# Patient Record
Sex: Male | Born: 1985 | Race: White | Hispanic: No | Marital: Single | Smoking: Never smoker
Health system: Southern US, Community
[De-identification: ages and names within clinical notes are randomized; demographics above are authoritative.]

## PROBLEM LIST (undated history)

## (undated) DIAGNOSIS — H9190 Unspecified hearing loss, unspecified ear: Secondary | ICD-10-CM

## (undated) DIAGNOSIS — E119 Type 2 diabetes mellitus without complications: Secondary | ICD-10-CM

---
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
## 2008-05-10 ENCOUNTER — Emergency Department (HOSPITAL_COMMUNITY): Admission: EM | Admit: 2008-05-10 | Discharge: 2008-05-10 | Payer: Self-pay | Admitting: Emergency Medicine

## 2010-09-20 ENCOUNTER — Emergency Department (HOSPITAL_COMMUNITY): Admission: EM | Admit: 2010-09-20 | Discharge: 2010-09-20 | Payer: Self-pay | Admitting: Emergency Medicine

## 2011-02-11 LAB — CBC
HCT: 46.5 % (ref 39.0–52.0)
Hemoglobin: 16.2 g/dL (ref 13.0–17.0)
MCH: 28 pg (ref 26.0–34.0)
MCHC: 34.9 g/dL (ref 30.0–36.0)
MCV: 80.1 fL (ref 78.0–100.0)
Platelets: 190 10*3/uL (ref 150–400)

## 2011-02-11 LAB — BASIC METABOLIC PANEL
GFR calc Af Amer: >60 mL/min (ref >60)
Glucose, Bld: 126 mg/dL — ABNORMAL HIGH (ref 70–99)
Potassium: 3.9 mEq/L (ref 3.5–5.1)

## 2011-02-11 LAB — DIFFERENTIAL
Basophils Absolute: 0 10*3/uL (ref 0.0–0.1)
Monocytes Relative: 7 % (ref 3–12)
Neutrophils Relative %: 49 % (ref 43–77)

## 2018-03-16 ENCOUNTER — Emergency Department (HOSPITAL_BASED_OUTPATIENT_CLINIC_OR_DEPARTMENT_OTHER)
Admission: EM | Admit: 2018-03-16 | Discharge: 2018-03-16 | Disposition: A | Discharge status: Home / Self Care | Payer: BLUE CROSS/BLUE SHIELD | Attending: Emergency Medicine | Admitting: Emergency Medicine

## 2018-03-16 ENCOUNTER — Encounter (HOSPITAL_BASED_OUTPATIENT_CLINIC_OR_DEPARTMENT_OTHER): Payer: Self-pay | Admitting: *Deleted

## 2018-03-16 ENCOUNTER — Other Ambulatory Visit: Payer: Self-pay

## 2018-03-16 DIAGNOSIS — K029 Dental caries, unspecified: Secondary | ICD-10-CM | POA: Insufficient documentation

## 2018-03-16 DIAGNOSIS — K0889 Other specified disorders of teeth and supporting structures: Secondary | ICD-10-CM | POA: Diagnosis present

## 2018-03-16 HISTORY — DX: Unspecified hearing loss, unspecified ear: H91.90

## 2018-03-16 MED ORDER — IBUPROFEN 100 MG/5ML PO SUSP: 800.0000 mg | ORAL | 0 refills | Status: AC | PRN

## 2018-03-16 MED ORDER — AMOXICILLIN 400 MG/5ML PO SUSR: 1000.0000 mg | Freq: Three times a day (TID) | ORAL | 0 refills | Status: AC

## 2018-03-16 NOTE — ED Notes (Signed)
Pt is HOH with personal amplifier and bilateral hearing aides.

## 2018-03-16 NOTE — ED Triage Notes (Signed)
Pt c/o right dental pain x 1 month

## 2018-03-16 NOTE — ED Notes (Signed)
Pt asked how much longer will be here in the ED, because he hasn't eaten and he is hungry. Greta DoomBowie, PA made aware. Pt given crackers, peanut butter, and updated on plan of care.

## 2018-03-16 NOTE — ED Provider Notes (Addendum)
[facility] EMERGENCY DEPARTMENT Provider Note   CSN: [id number] Arrival date & time: 03/16/18  2008     History   Chief Complaint Chief Complaint  Patient presents with  . Dental Pain    HPI Eric Hines is a 32 y.o. male.  HPI   32 year old male presenting for evaluation of dental pain.  Patient report for approximately 1 month and a half he has had recurrent worsening dental pain.  Pain is involving his right upper and lower molar.  He is aware that there is decay in the molar and needs to have it extracted.  He was initially seen by a dentist for this complaint and was referred to an oral surgeon but unable to get care because he does not have insurance.  He did receive amoxicillin antibiotic over a month ago and it did help.  He is currently taking liquid Motrin with some relief.  Increasing pain with chewing and sometimes with temperature changes.  Pain is moderate in severity.  There is no associated fever, hearing changes, neck pain, or trouble swallowing.  Denies any recent injury.  Past Medical History:  Diagnosis Date  . Deaf     There are no active problems to display for this patient.   History reviewed. No pertinent surgical history.      Home Medications    Prior to Admission medications   Medication Sig Start Date End Date Taking? Authorizing Provider  ibuprofen (ADVIL,MOTRIN) 100 MG/5ML suspension Take 800 mg by mouth every 4 (four) hours as needed.   Yes [provider]    Family History History reviewed. No pertinent family history.  Social History Social History   Tobacco Use  . Smoking status: Never Smoker  . Smokeless tobacco: Never Used  Substance Use Topics  . Alcohol use: Never    Frequency: Never  . Drug use: Never     Allergies   Latex   Review of Systems Review of Systems  Constitutional: Negative for fever.  HENT: Positive for dental problem. Negative for ear pain.   Respiratory: Negative for  shortness of breath.   Cardiovascular: Negative for chest pain.     Physical Exam Updated Vital Signs BP (!) 171/92   Pulse 75   Temp 98.8 F (37.1 C) (Oral)   Resp 16   Ht 5\' 6"  (1.676 m)   Wt (!) 140.6 kg (310 lb)   SpO2 98%   BMI 50.04 kg/m   Physical Exam  Constitutional: He appears well-developed and well-nourished. No distress.  HENT:  Head: Atraumatic.  Right Ear: External ear normal.  Left Ear: External ear normal.  Mouth: Moderate dental decay noted to tooth #1 and tooth #32 with tenderness to palpation but no obvious abscess or gingival erythema noted.  No trismus.  Eyes: Conjunctivae are normal.  Neck: Neck supple.  Neurological: He is alert.  Skin: No rash noted.  Psychiatric: He has a normal mood and affect.  Nursing note and vitals reviewed.    ED Treatments / Results  Labs (all labs ordered are listed, but only abnormal results are displayed) Labs Reviewed - No data to display  EKG None  Radiology No results found.  Procedures Procedures (including critical care time)  Medications Ordered in ED Medications - No data to display   Initial Impression / Assessment and Plan / ED Course  I have reviewed the triage vital signs and the nursing notes.  Pertinent labs & imaging results that were available during my  care of the patient were reviewed by me and considered in my medical decision making (see chart for details).     BP (!) 171/92   Pulse 75   Temp 98.8 F (37.1 C) (Oral)   Resp 16   Ht 5\' 6"  (1.676 m)   Wt (!) 140.6 kg (310 lb)   SpO2 98%   BMI 50.04 kg/m  The patient was noted to be hypertensive today in the emergency department. I have spoken with the patient regarding hypertension and the need for improved management. I instructed the patient to followup with the Primary care doctor within 4 days to improve the management of the patient's hypertension. I also counseled the patient regarding the signs and symptoms which would  require an emergent visit to an emergency department for hypertensive urgency and/or hypertensive emergency. The patient understood the need for improved hypertensive management.   Final Clinical Impressions(s) / ED Diagnoses   Final diagnoses:  Pain due to dental caries    ED Discharge Orders        Ordered    ibuprofen (ADVIL,MOTRIN) 100 MG/5ML suspension  Every 4 hours PRN     03/16/18 2300    amoxicillin (AMOXIL) 400 MG/5ML suspension  3 times daily     03/16/18 2300     10:15 PM Patient here with recurrent dental pain involving his right upper and lower molar.  There is evidence of decay.  No obvious abscess appreciated.  No facial involvement.  Patient discharged home with amoxicillin.  Dental referral given as needed.  Return precautions discussed. Pt can only tolerates liquid medications.    Fayrene Helperran, Purl Claytor, PA-C 03/16/18 2300    Fayrene Helperran, Gray Maugeri, PA-C 03/16/18 [id number]    Benjiman CorePickering, Nathan, MD 03/17/18 [phone]

## 2022-02-04 ENCOUNTER — Other Ambulatory Visit: Payer: Self-pay

## 2022-02-04 ENCOUNTER — Emergency Department (INDEPENDENT_AMBULATORY_CARE_PROVIDER_SITE_OTHER): Payer: BC Managed Care – PPO

## 2022-02-04 ENCOUNTER — Emergency Department
Admission: EM | Admit: 2022-02-04 | Discharge: 2022-02-04 | Disposition: A | Discharge status: Home / Self Care | Payer: BC Managed Care – PPO | Source: Home / Self Care | Attending: Family Medicine | Admitting: Family Medicine

## 2022-02-04 ENCOUNTER — Encounter: Payer: Self-pay | Admitting: Emergency Medicine

## 2022-02-04 DIAGNOSIS — R109 Unspecified abdominal pain: Secondary | ICD-10-CM

## 2022-02-04 DIAGNOSIS — Z91199 Patient's noncompliance with other medical treatment and regimen due to unspecified reason: Secondary | ICD-10-CM

## 2022-02-04 DIAGNOSIS — R030 Elevated blood-pressure reading, without diagnosis of hypertension: Secondary | ICD-10-CM

## 2022-02-04 DIAGNOSIS — R739 Hyperglycemia, unspecified: Secondary | ICD-10-CM

## 2022-02-04 HISTORY — DX: Morbid (severe) obesity due to excess calories: E66.01

## 2022-02-04 HISTORY — DX: Type 2 diabetes mellitus without complications: E11.9

## 2022-02-04 LAB — POCT URINALYSIS DIP (MANUAL ENTRY)
Bilirubin, UA: NEGATIVE
Blood, UA: NEGATIVE
Glucose, UA: 500 mg/dL — AB
Ketones, POC UA: NEGATIVE mg/dL
Leukocytes, UA: NEGATIVE
Nitrite, UA: NEGATIVE
Protein Ur, POC: NEGATIVE mg/dL
Spec Grav, UA: 1.015 (ref 1.010–1.025)
Urobilinogen, UA: 0.2 E.U./dL
pH, UA: 6 (ref 5.0–8.0)

## 2022-02-04 LAB — POCT FASTING CBG KUC MANUAL ENTRY: POCT Glucose (KUC): 290 mg/dL — AB (ref 70–99)

## 2022-02-04 MED ORDER — ORA-SWEET PO SYRP: 20.0000 mg | Freq: Two times a day (BID) | ORAL | 0 refills | Status: AC

## 2022-02-04 NOTE — ED Notes (Signed)
Critical lab result on U/A - glucose 500mg /dl - results to Dr verbally - results verified verbally by Dr Delton See ?

## 2022-02-04 NOTE — Discharge Instructions (Addendum)
I believe that your right flank pain is muscular.  No evidence of kidney stone or kidney disease ?Take ibuprofen suspension 600 mg to 800 mg 3 times a day.  Take with food ?I have prescribed Lioresal suspension.  This is a mild muscle relaxer.  It will help at bedtime.  Use with caution when you are on your way to work and with driving ?Use ice or heat to painful back area ? ?It is important that you see your primary care doctor at least twice a year for diabetes and blood pressure.  You need to be back on diabetes medications to prevent complications ? ?

## 2022-02-04 NOTE — ED Provider Notes (Signed)
?[facility] ? ? ? ?CSN: [id number] ?Arrival date & time: 02/04/22  1149 ? ? ?  ? ?History   ?Chief Complaint ?Chief Complaint  ?Patient presents with  ? Flank Pain  ?  right  ? ? ?HPI ?Eric Hines is a 36 y.o. male.  ? ?HPI ?Patient seen for flank pain ?He prefers lipreading as a form of communication however this is difficult with the current mask recommendations during COVID 19 pandemic ?American sign language interpreter used for portion of evaluation ?Patient is a diabetic has not been under medical care taking any medication over the last couple of years ?He says he has had flank pain Janey Greaser on for 3 weeks, worse today.  He has not noticed dysuria, frequency, hematuria, nausea or vomiting, fever or chills. ? ?Past Medical History:  ?Diagnosis Date  ? Deaf   ? Diabetes mellitus without complication (HCC)   ? Morbid obesity due to excess calories (HCC)   ? ? ?Patient Active Problem List  ? Diagnosis Date Noted  ? Morbid obesity due to excess calories (HCC) 02/04/2022  ? ? ?History reviewed. No pertinent surgical history. ? ? ? ? ?Home Medications   ? ?Prior to Admission medications   ?Medication Sig Start Date End Date Taking? Authorizing Provider  ?baclofen (LIORESAL) oral suspension 10 mg/mL mixture Take 2 mLs (20 mg total) by mouth 2 (two) times daily. May cause drowsiness.  This is useful at bedtime. 02/04/22  Yes Eustace Moore, MD  ?ibuprofen (ADVIL,MOTRIN) 100 MG/5ML suspension Take 40 mLs (800 mg total) by mouth every 4 (four) hours as needed. 03/16/18   Fayrene Helper, PA-C  ? ? ?Family History ?Family History  ?Problem Relation Age of Onset  ? Healthy Mother   ? Cancer Father   ? ? ?Social History ?Social History  ? ?Tobacco Use  ? Smoking status: Never  ? Smokeless tobacco: Never  ?Vaping Use  ? Vaping Use: Never used  ?Substance Use Topics  ? Alcohol use: Never  ? Drug use: Never  ? ? ? ?Allergies   ?Latex ? ? ?Review of Systems ?Review of Systems ?See HPI ? ?Physical Exam ?Triage Vital  Signs ?ED Triage Vitals  ?Enc Vitals Group  ?   BP 02/04/22 1221 (!) 174/80  ?   Pulse Rate 02/04/22 1221 79  ?   Resp 02/04/22 1221 18  ?   Temp 02/04/22 1221 98.6 ?F (37 ?C)  ?   Temp Source 02/04/22 1221 Oral  ?   SpO2 02/04/22 1221 98 %  ?   Weight --   ?   Height 02/04/22 1227 5\' 6"  (1.676 m)  ?   Head Circumference --   ?   Peak Flow --   ?   Pain Score --   ?   Pain Loc --   ?   Pain Edu? --   ?   Excl. in GC? --   ? ?No data found. ? ?Updated Vital Signs ?BP (!) 174/80 (BP Location: Left Arm)   Pulse 79   Temp 98.6 ?F (37 ?C) (Oral)   Resp 18   Ht 5\' 6"  (1.676 m)   Wt 134.3 kg   SpO2 98%   BMI 47.78 kg/m?  ?:    ? ?Physical Exam ?Constitutional:   ?   General: He is not in acute distress. ?   Appearance: He is well-developed. He is obese. He is not ill-appearing.  ?HENT:  ?   Head: Normocephalic and  atraumatic.  ?   Nose:  ?   Comments: Mask is in place ?Eyes:  ?   Conjunctiva/sclera: Conjunctivae normal.  ?   Pupils: Pupils are equal, round, and reactive to light.  ?Cardiovascular:  ?   Rate and Rhythm: Normal rate and regular rhythm.  ?   Heart sounds: Normal heart sounds.  ?Pulmonary:  ?   Effort: Pulmonary effort is normal. No respiratory distress.  ?   Breath sounds: Normal breath sounds.  ?Chest:  ?   Chest wall: No tenderness.  ?Abdominal:  ?   General: Abdomen is flat. There is no distension.  ?   Palpations: Abdomen is soft.  ?   Tenderness: There is no abdominal tenderness. There is no right CVA tenderness or left CVA tenderness.  ?Musculoskeletal:     ?   General: Normal range of motion.  ?   Cervical back: Normal range of motion.  ?   Comments: Patient has pain with movement.  Grimaces as he gets on and off the table.  Tenderness in the right paraspinous lumbar muscles.  No CVA tenderness to percussion.  Abdominal exam with minor tenderness in the right middle abdomen.  ?Skin: ?   General: Skin is warm and dry.  ?Neurological:  ?   General: No focal deficit present.  ?   Mental Status: He  is alert.  ?Psychiatric:     ?   Mood and Affect: Mood normal.     ?   Behavior: Behavior normal.  ? ? ? ?UC Treatments / Results  ?Labs ?(all labs ordered are listed, but only abnormal results are displayed) ?Labs Reviewed  ?POCT URINALYSIS DIP (MANUAL ENTRY) - Abnormal; Notable for the following components:  ?    Result Value  ? Glucose, UA =500 (*)   ? All other components within normal limits  ?POCT FASTING CBG [facility] MANUAL ENTRY - Abnormal; Notable for the following components:  ? POCT Glucose ([facility]) 290 (*)   ? All other components within normal limits  ? ? ?EKG ? ? ?Radiology ?DG Chest 2 View ? ?Result Date: 02/04/2022 ?CLINICAL DATA:  Right flank pain EXAM: CHEST - 2 VIEW COMPARISON:  02/17/2017 FINDINGS: The cardiac silhouette, mediastinal and hilar contours are within normal limits. There is perihilar peribronchial thickening and slight increased interstitial markings suggesting bronchitis or interstitial pneumonitis. No focal airspace consolidation to suggest typical pneumonia. No pleural effusions or pulmonary lesions. The bony thorax is intact. IMPRESSION: Findings suggest bronchitis or interstitial pneumonitis. No infiltrates or effusions. Electronically Signed   By: Rudie MeyerP.  Gallerani M.D.   On: 02/04/2022 13:38  ? ?DG Abdomen 1 View ? ?Result Date: 02/04/2022 ?CLINICAL DATA:  Right flank pain for 3 weeks. EXAM: ABDOMEN - 1 VIEW COMPARISON:  None. FINDINGS: The bowel gas pattern is unremarkable. No findings for obstruction or perforation. The soft tissue shadows are maintained. No worrisome calcifications. The bony structures are. IMPRESSION: Unremarkable abdominal radiograph. Electronically Signed   By: Rudie MeyerP.  Gallerani M.D.   On: 02/04/2022 13:31   ? ?Procedures ?Procedures (including critical care time) ? ?Medications Ordered in UC ?Medications - No data to display ? ?Initial Impression / Assessment and Plan / UC Course  ?I have reviewed the triage vital signs and the nursing notes. ? ?Pertinent labs & imaging  results that were available during my care of the patient were reviewed by me and considered in my medical decision making (see chart for details). ? ?  ? ?Patient has flank pain.  I think it is musculoskeletal.  We will treat accordingly ?Patient has untreated diabetes, noncompliance, and hyperglycemia.  PCP follow-up is recommended ?Patient also has elevated blood pressure reading.  Needs follow-up ? ?Final Clinical Impressions(s) / UC Diagnoses  ? ?Final diagnoses:  ?Flank pain  ?Noncompliance with diabetes treatment  ?Hyperglycemia  ?Elevated blood pressure reading  ? ? ? ?Discharge Instructions   ? ?  ?I believe that your right flank pain is muscular.  No evidence of kidney stone or kidney disease ?Take ibuprofen suspension 600 mg to 800 mg 3 times a day.  Take with food ?I have prescribed Lioresal suspension.  This is a mild muscle relaxer.  It will help at bedtime.  Use with caution when you are on your way to work and with driving ?Use ice or heat to painful back area ? ?It is important that you see your primary care doctor at least twice a year for diabetes and blood pressure.  You need to be back on diabetes medications to prevent complications ? ? ? ? ? ?ED Prescriptions   ? ? Medication Sig Dispense Auth. Provider  ? baclofen (LIORESAL) oral suspension 10 mg/mL mixture Take 2 mLs (20 mg total) by mouth 2 (two) times daily. May cause drowsiness.  This is useful at bedtime. 120 mL Eustace Moore, MD  ? ?  ? ?PDMP not reviewed this encounter. ?  ?Eustace Moore, MD ?02/04/22 1359 ? ?

## 2022-02-04 NOTE — ED Triage Notes (Addendum)
Right flank pain per pt  ?Intermittent pain x 3 weeks  ?ASL interpretor used - pt prefers reading lips ?Pt drives a truck  ?Pt drinks 80-90 oz  H2O daily ?Denies fevers ?No OTC pain meds - liquid meds only ?

## 2022-02-06 ENCOUNTER — Telehealth: Payer: Self-pay

## 2022-02-06 MED ORDER — BACLOFEN 10 MG PO TABS: 10.0000 mg | ORAL_TABLET | Freq: Three times a day (TID) | ORAL | 0 refills | Status: AC

## 2022-02-06 NOTE — Telephone Encounter (Signed)
Patient requested pill form of Baclofen as he is unable to find liquid Baclofen.  Patient advised that Baclofen has been prescribed to his pharmacy per his request. ?

## 2023-07-18 IMAGING — DX DG CHEST 2V
2 series · 2 of 2 positions shown · non-contrast
Comparison: 02/17/2017

CLINICAL DATA: Right flank pain

EXAM:
CHEST - 2 VIEW

[chest pa]
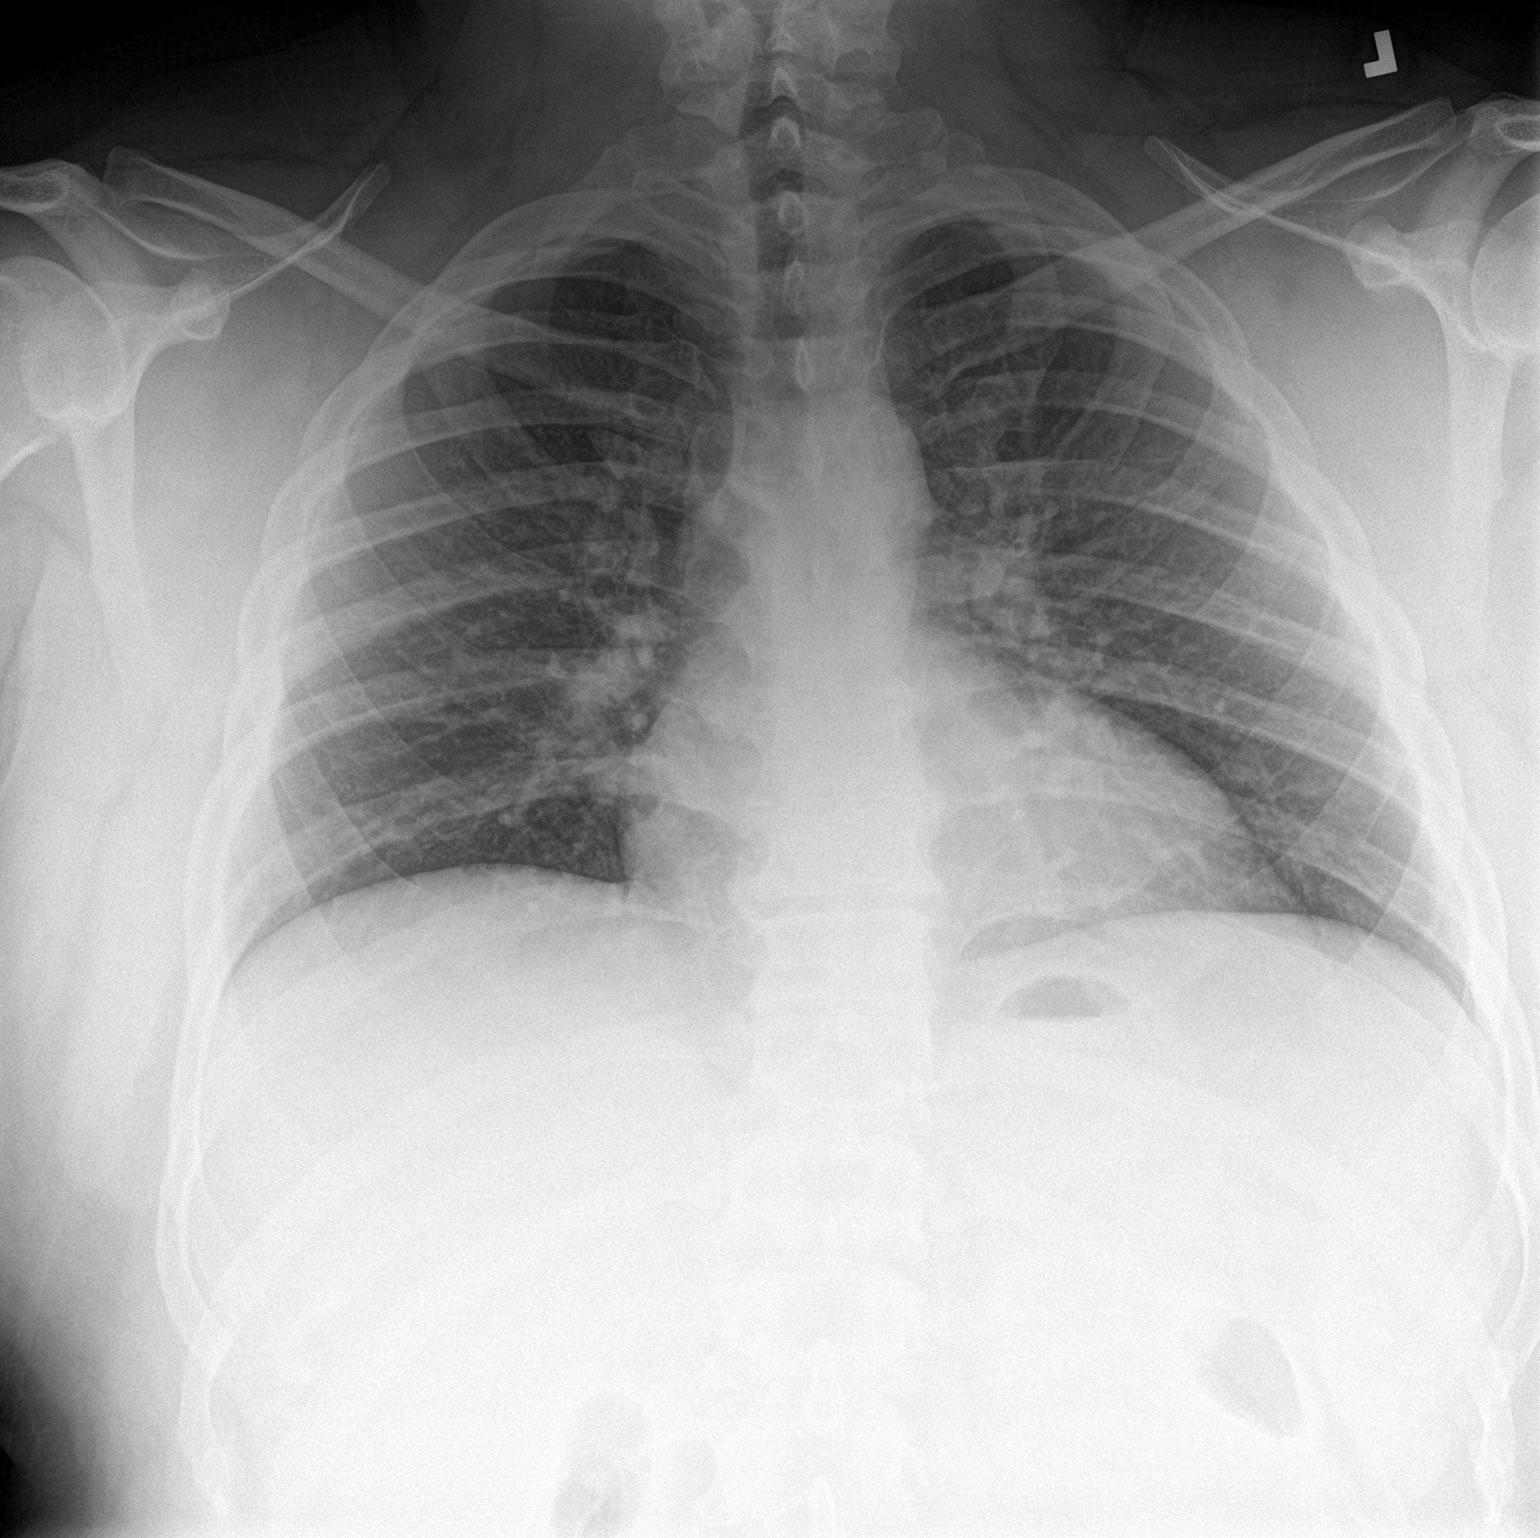

[chest lat]
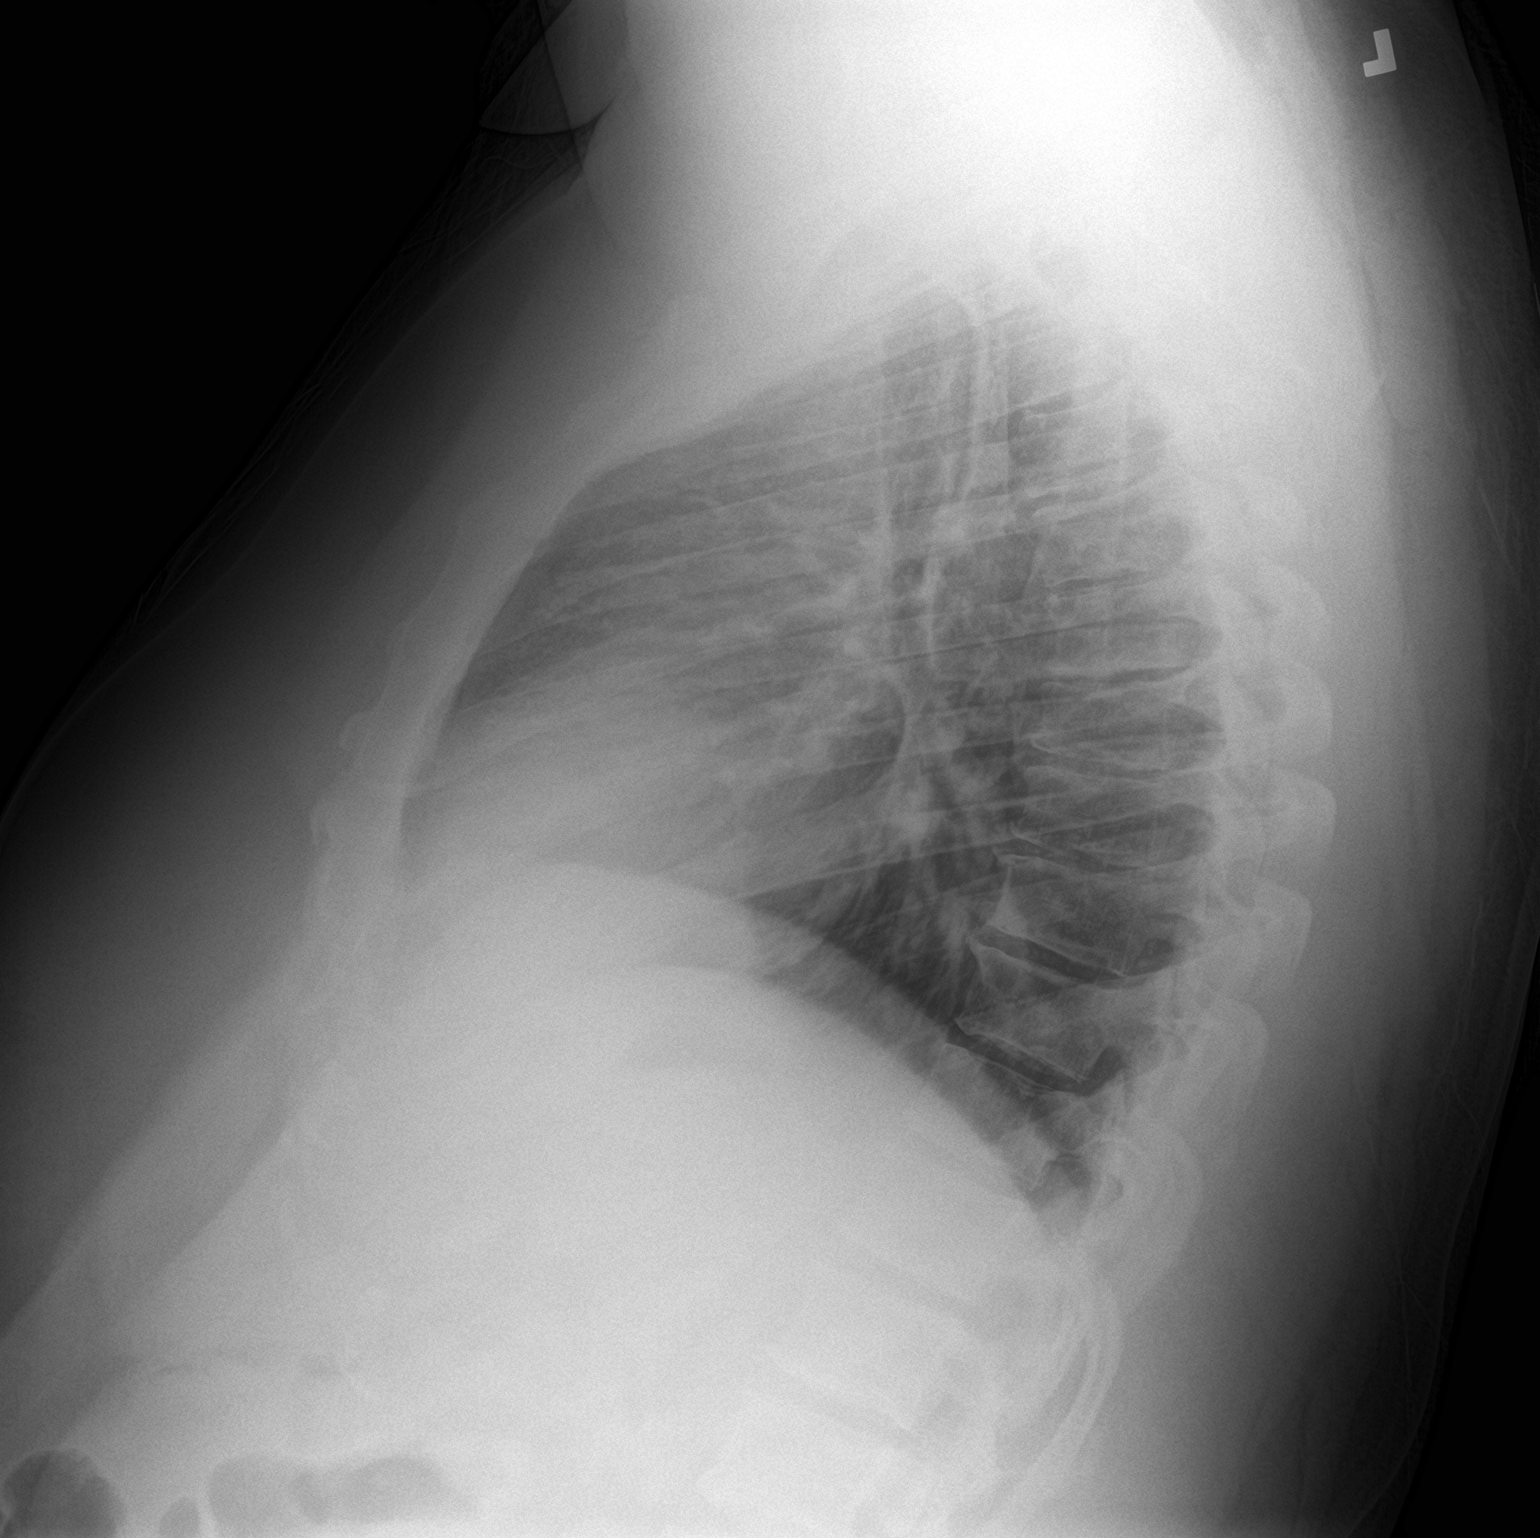

[2 of 2 positions shown; findings below may reference images not displayed]

FINDINGS: The cardiac silhouette, mediastinal and hilar contours are within
normal limits. There is perihilar peribronchial thickening and
slight increased interstitial markings suggesting bronchitis or
interstitial pneumonitis. No focal airspace consolidation to suggest
typical pneumonia. No pleural effusions or pulmonary lesions. The
bony thorax is intact.
IMPRESSION: Findings suggest bronchitis or interstitial pneumonitis. No
infiltrates or effusions.

## 2023-07-18 IMAGING — DX DG ABDOMEN 1V
4 series · 4 of 4 positions shown · non-contrast
Comparison: None.

CLINICAL DATA: Right flank pain for 3 weeks.

EXAM:
ABDOMEN - 1 VIEW

[abdomen kub (1 of 4)]
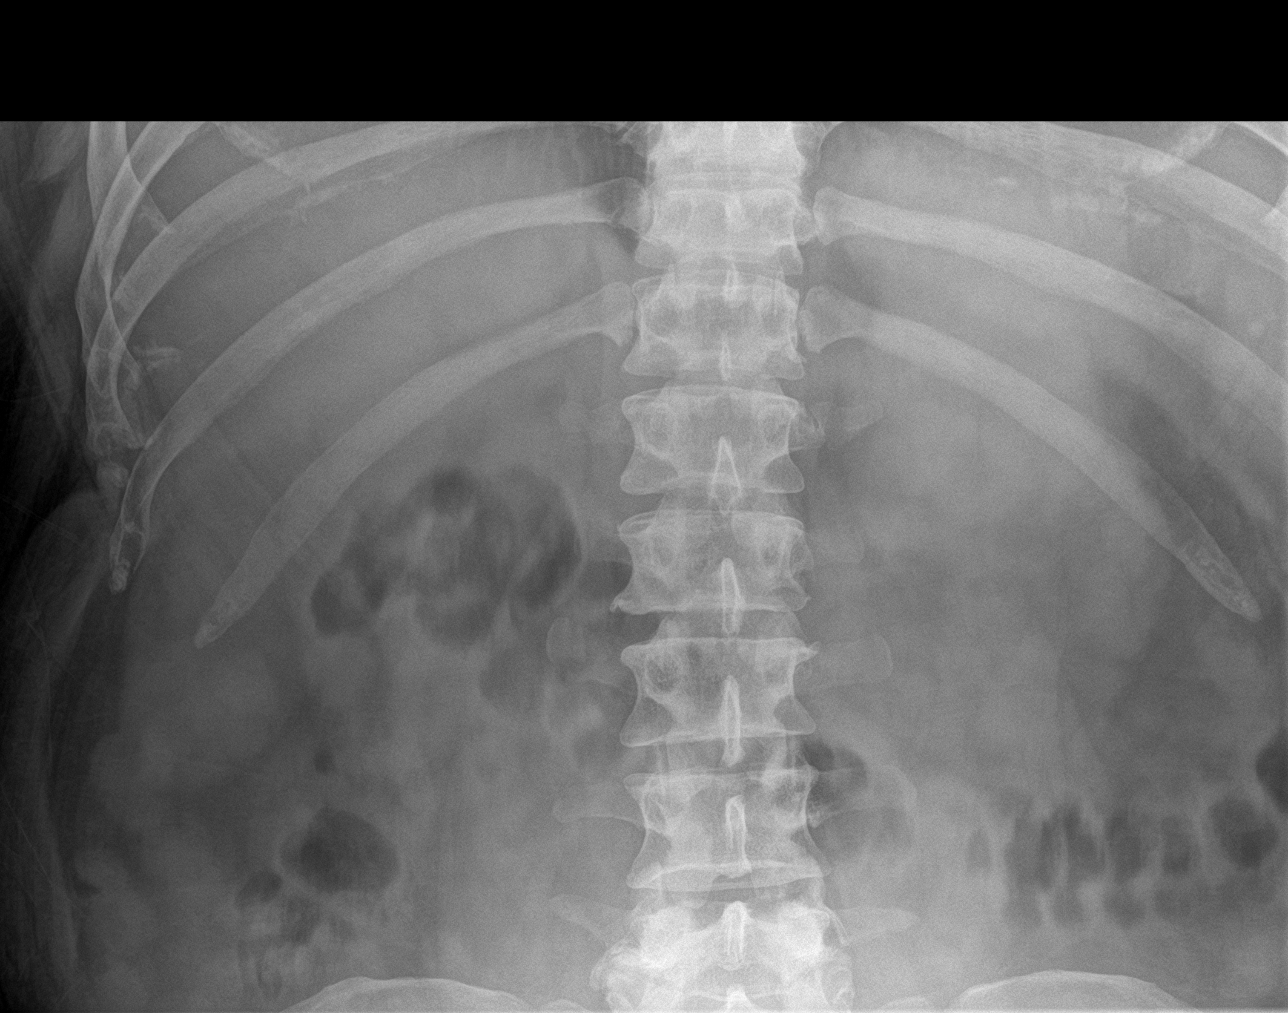

[abdomen kub (2 of 4)]
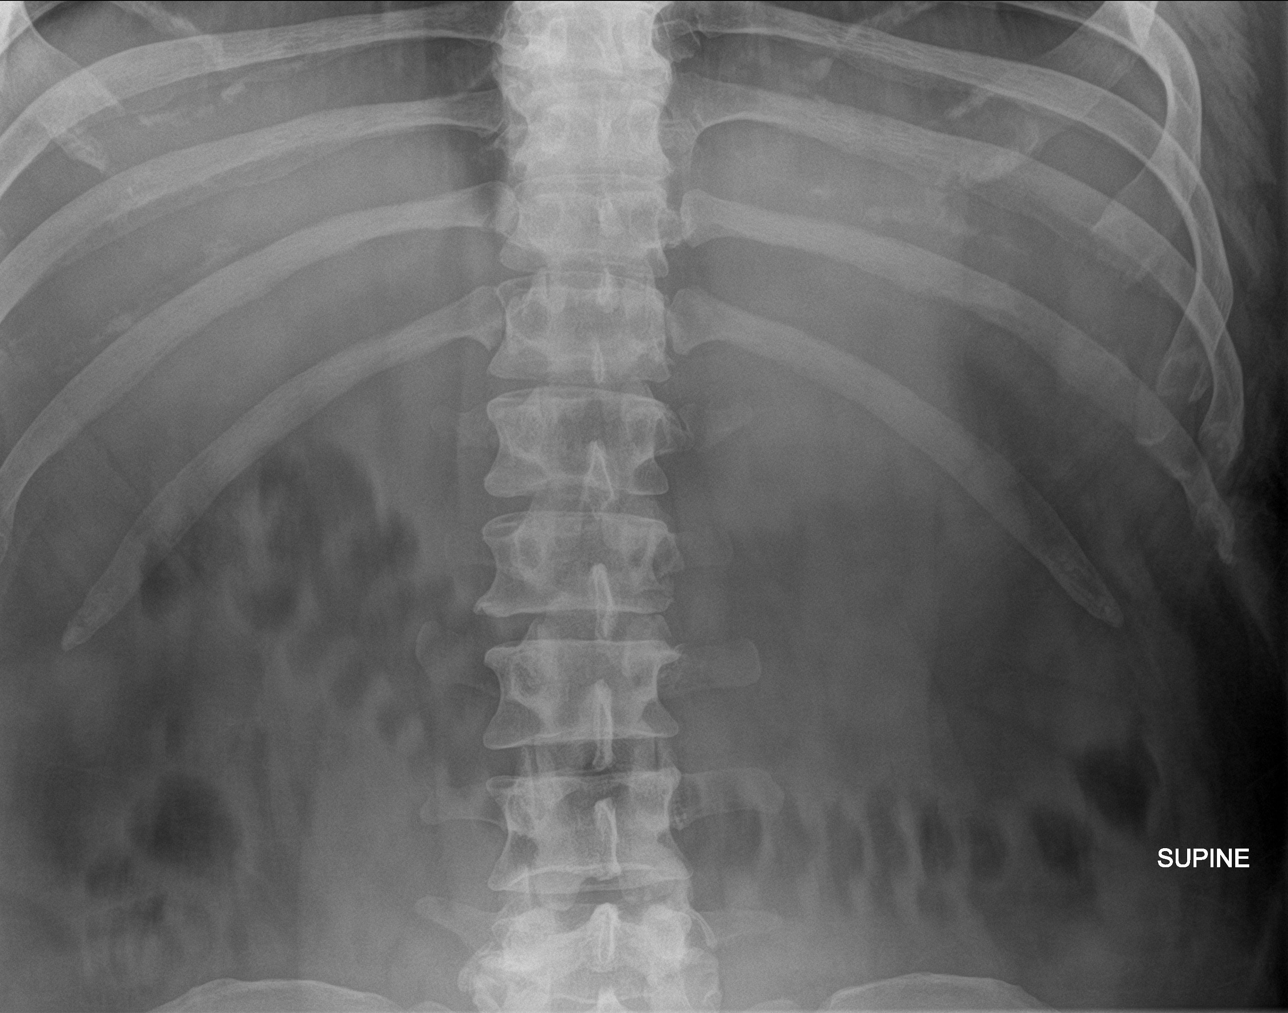

[abdomen kub (3 of 4)]
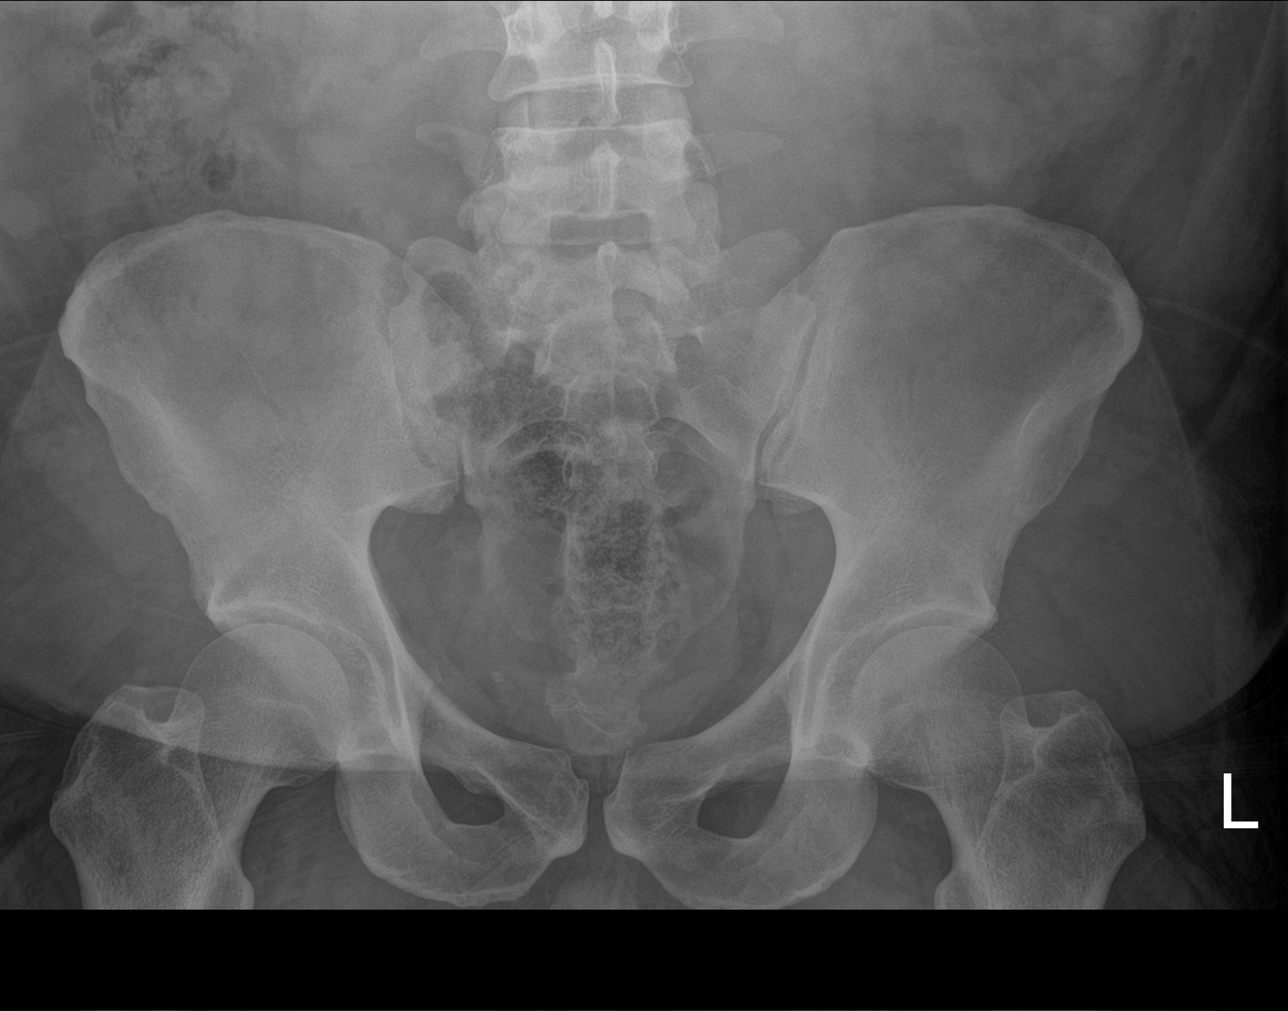

[abdomen kub (4 of 4)]
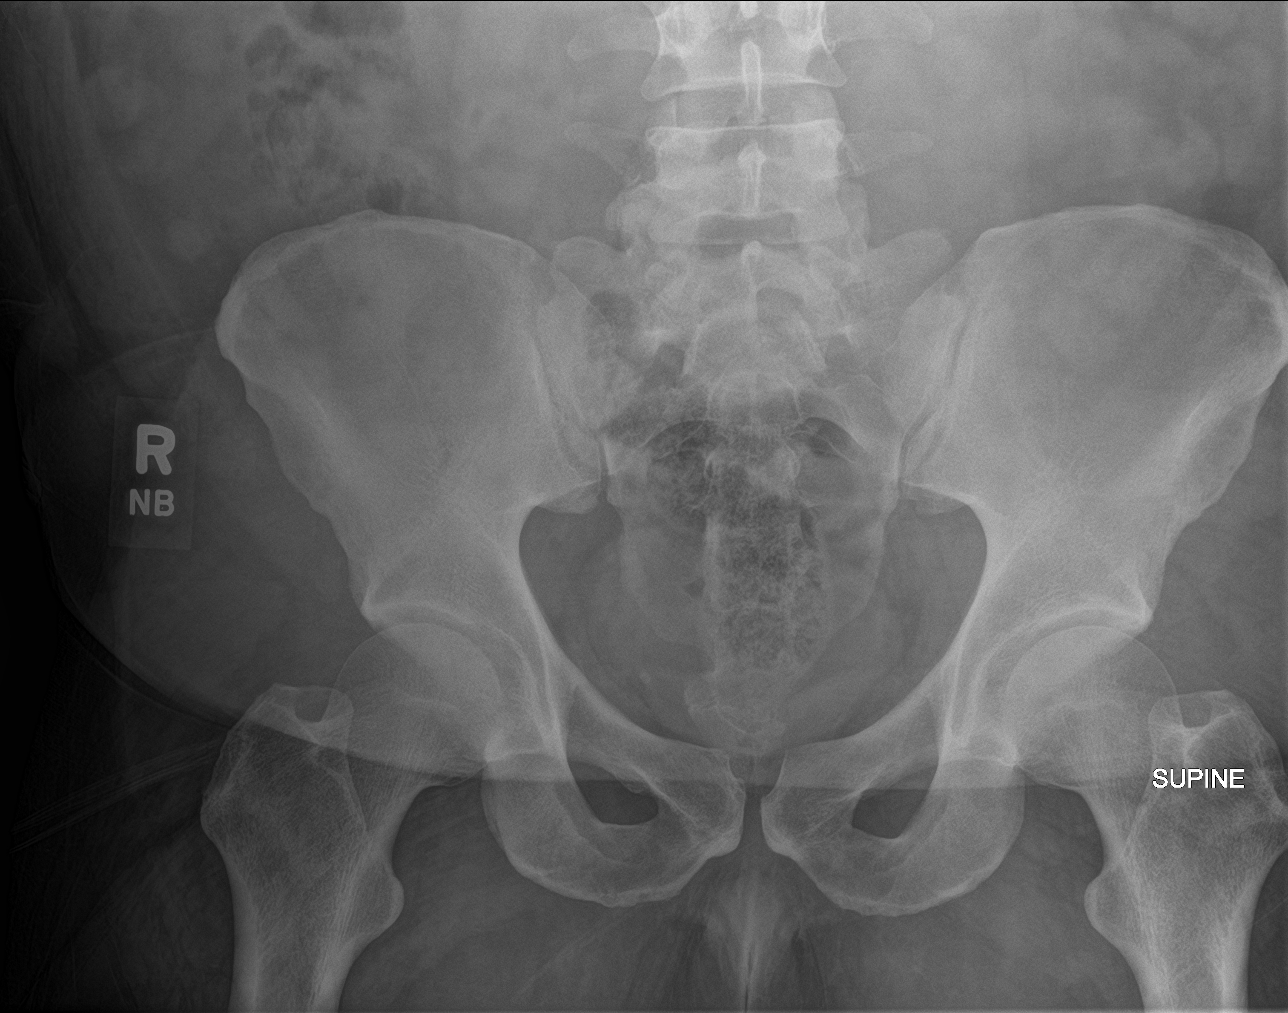

[4 of 4 positions shown; findings below may reference images not displayed]

FINDINGS: The bowel gas pattern is unremarkable. No findings for obstruction
or perforation. The soft tissue shadows are maintained. No worrisome
calcifications. The bony structures are.
IMPRESSION: Unremarkable abdominal radiograph.

## 2024-12-18 ENCOUNTER — Ambulatory Visit: Payer: Self-pay

## 2024-12-18 NOTE — Telephone Encounter (Signed)
 Reason for Disposition  [1] MODERATE pain (e.g., interferes with normal activities) AND [2] pain comes and goes (cramps) AND [3] present > 24 hours  (Exception: Pain with Vomiting or Diarrhea - see that Guideline.)  Answer Assessment - Initial Assessment Questions 1. LOCATION: Where does it hurt?      Right side below ribs 2. RADIATION: Does the pain shoot anywhere else? (e.g., chest, back)     no 3. ONSET: When did the pain begin? (Minutes, hours or days ago)      2 days 4. SUDDEN: Gradual or sudden onset?     sudden 5. PATTERN Does the pain come and go, or is it constant?     Constant - worse when he coughs for farts 6. SEVERITY: How bad is the pain?  (e.g., Scale 1-10; mild, moderate, or severe)     6-7/10 7. RECURRENT SYMPTOM: Have you ever had this type of stomach pain before? If Yes, ask: When was the last time? and What happened that time?      no 8. CAUSE: What do you think is causing the stomach pain? (e.g., gallstones, recent abdominal surgery)     Pulled muscle - maybe 9. RELIEVING/AGGRAVATING FACTORS: What makes it better or worse? (e.g., antacids, bending or twisting motion, bowel movement)     Not that he knows of 10. OTHER SYMPTOMS: Do you have any other symptoms? (e.g., back pain, diarrhea, fever, urination pain, vomiting)       no  Protocols used: Abdominal Pain - Male-A-AH

## 2024-12-18 NOTE — Telephone Encounter (Signed)
 PAS states patient disconnected prior to warm transfer and provided 2 call back #s for patient. [phone] and [phone]. Reached patient and he states he is hard of hearing and uses an app for hearing assistance. He states he will call back and hung up.    Message from [place] S sent at 12/18/2024  2:50 PM EST  Reason for Triage: Pain in right side of stomach and redness. Went to urgent care today and was told to see PCP. His PCP is closed today. Patient stated he had to hang up and call from a different number.  Callback #: [phone]
